# Patient Record
Sex: Male | Born: 1970 | Race: White | Hispanic: No
Health system: Southern US, Community
[De-identification: ages and names within clinical notes are randomized; demographics above are authoritative.]

---

## 2010-02-05 ENCOUNTER — Emergency Department (HOSPITAL_COMMUNITY): Admission: EM | Admit: 2010-02-05 | Discharge: 2010-02-05 | Payer: Self-pay | Admitting: Emergency Medicine

## 2010-07-15 LAB — CBC
HCT: 43 % (ref 39.0–52.0)
Hemoglobin: 15.6 g/dL (ref 13.0–17.0)
MCH: 33.1 pg (ref 26.0–34.0)
MCHC: 36.3 g/dL — ABNORMAL HIGH (ref 30.0–36.0)
MCV: 91.3 fL (ref 78.0–100.0)
RDW: 12.4 % (ref 11.5–15.5)

## 2010-07-15 LAB — POCT I-STAT, CHEM 8
Calcium, Ion: 1.1 mmol/L — ABNORMAL LOW (ref 1.12–1.32)
Chloride: 108 mEq/L (ref 96–112)
Glucose, Bld: 89 mg/dL (ref 70–99)
HCT: 46 % (ref 39.0–52.0)
Hemoglobin: 15.6 g/dL (ref 13.0–17.0)
TCO2: 22 mmol/L (ref 0–100)

## 2010-07-15 LAB — URINALYSIS, ROUTINE W REFLEX MICROSCOPIC
Bilirubin Urine: NEGATIVE
Ketones, ur: NEGATIVE mg/dL
Nitrite: NEGATIVE
Specific Gravity, Urine: 1.015 (ref 1.005–1.030)
Urobilinogen, UA: 0.2 mg/dL (ref 0.0–1.0)
pH: 6 (ref 5.0–8.0)

## 2010-07-15 LAB — DIFFERENTIAL
Basophils Absolute: 0 10*3/uL (ref 0.0–0.1)
Basophils Relative: 0 % (ref 0–1)
Eosinophils Absolute: 0 10*3/uL (ref 0.0–0.7)
Eosinophils Relative: 0 % (ref 0–5)
Monocytes Absolute: 0.5 10*3/uL (ref 0.1–1.0)
Monocytes Relative: 7 % (ref 3–12)

## 2010-07-15 LAB — URINE CULTURE: Culture  Setup Time: 201110080035

## 2010-07-15 LAB — URINE MICROSCOPIC-ADD ON

## 2011-03-07 IMAGING — CT CT ABD-PELV W/O CM
2 of 4 series · 14 of 32 positions shown, 19 images · non-contrast
Comparison: None.

CLINICAL DATA: Right lower abdominal pain and hematuria.

CT ABDOMEN AND PELVIS WITHOUT CONTRAST
TECHNIQUE: Multidetector CT imaging of the abdomen and pelvis was
performed following the standard protocol without intravenous
contrast.

[Series 2: renal stone · axial · 0.87mm/px · z∈[-532,-158]mm · 7 of 101 slices shown, 12 images]
[im 13/101  soft-tissue]
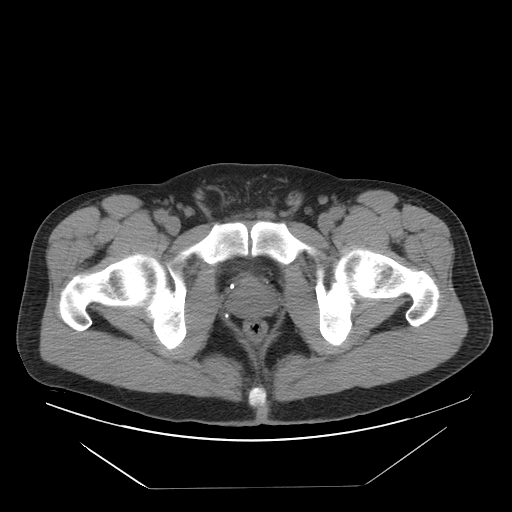
[im 13/101  bone]
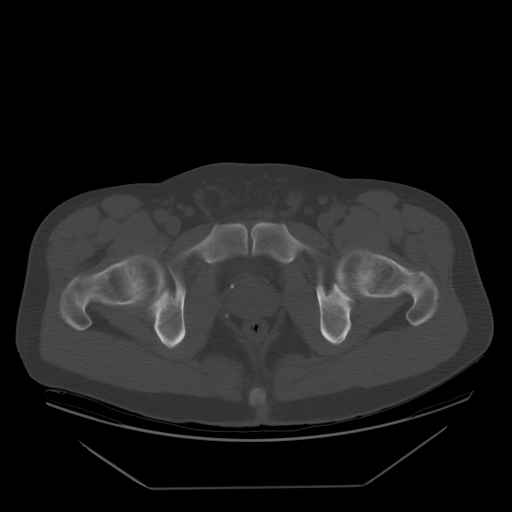
[im 26/101  soft-tissue]
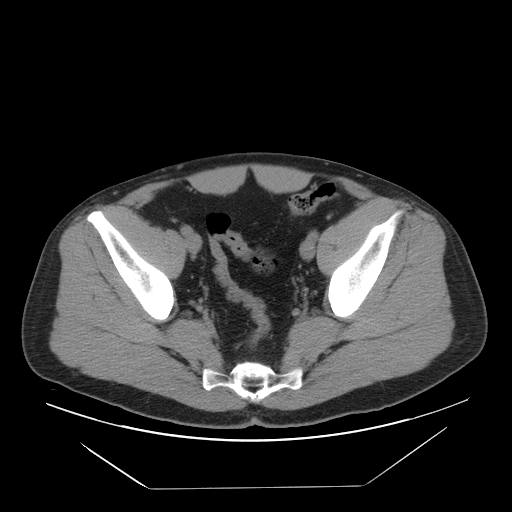
[im 38/101  soft-tissue]
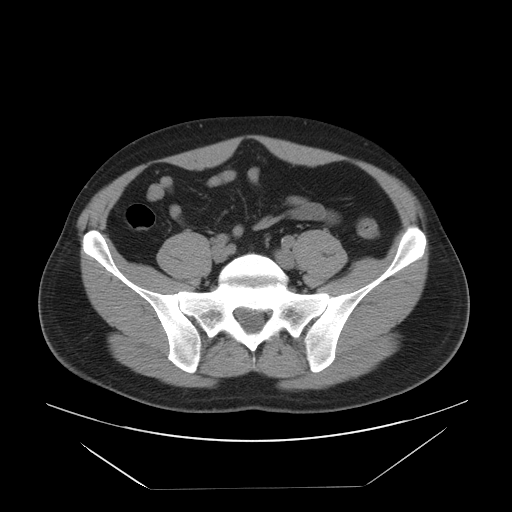
[im 51/101  soft-tissue]
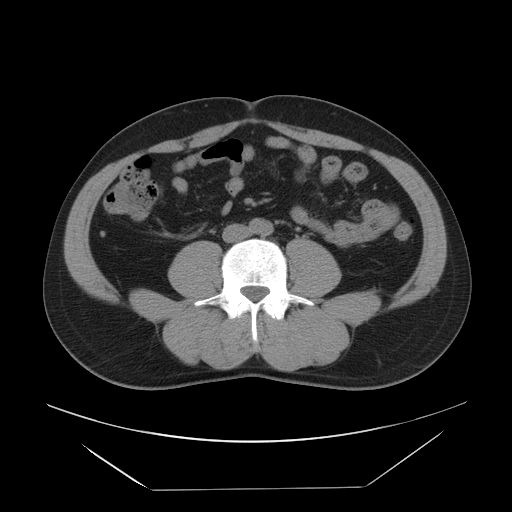
[im 51/101  lung]
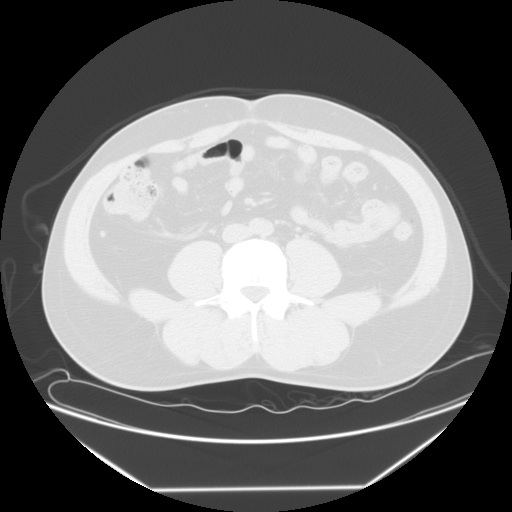
[im 63/101  soft-tissue]
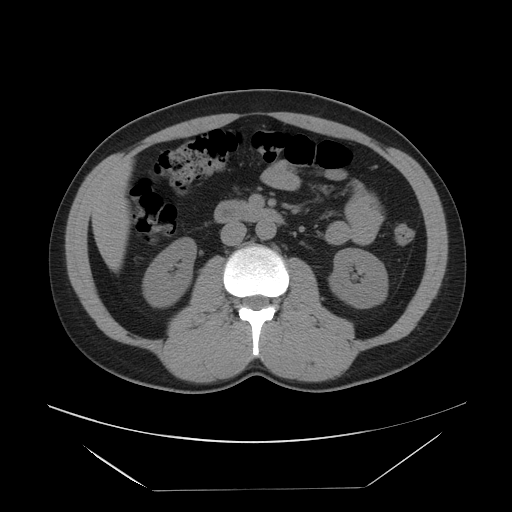
[im 63/101  lung]
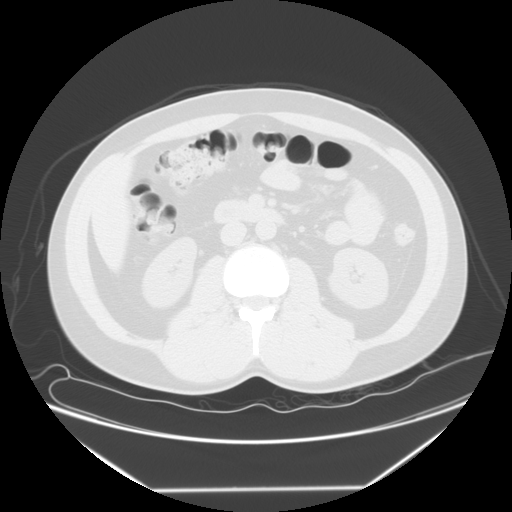
[im 76/101  soft-tissue]
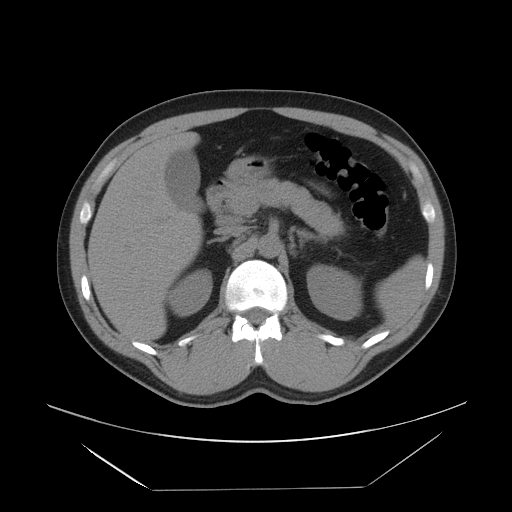
[im 76/101  lung]
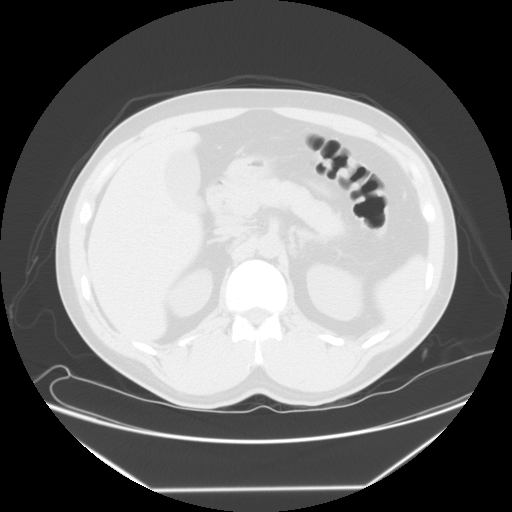
[im 88/101  soft-tissue]
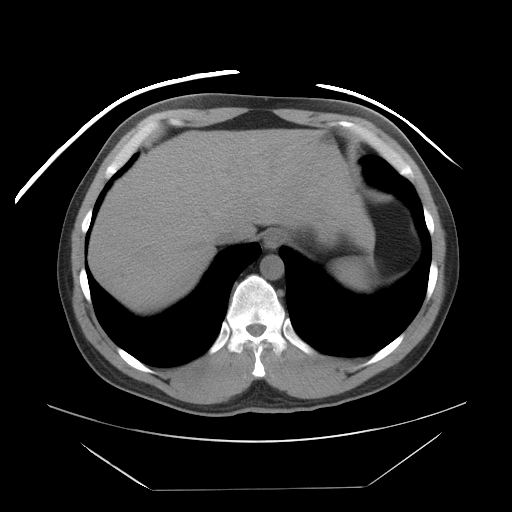
[im 88/101  lung]
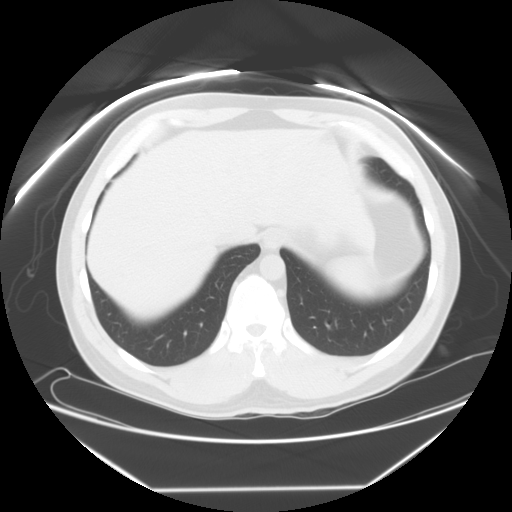

[Series 400: sag · sagittal · 1.00mm/px · 7 of 115 slices shown]
[im 12/115  soft-tissue]
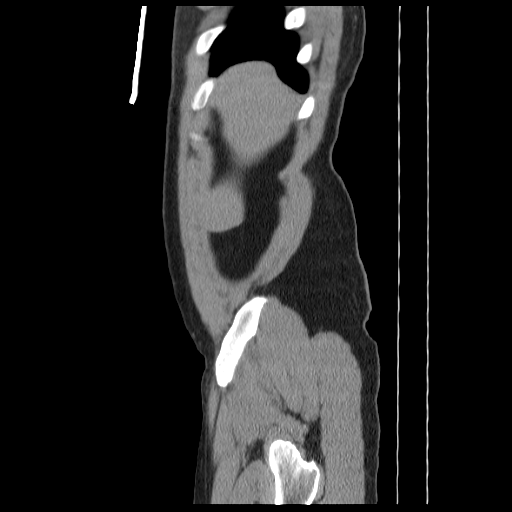
[im 23/115  soft-tissue]
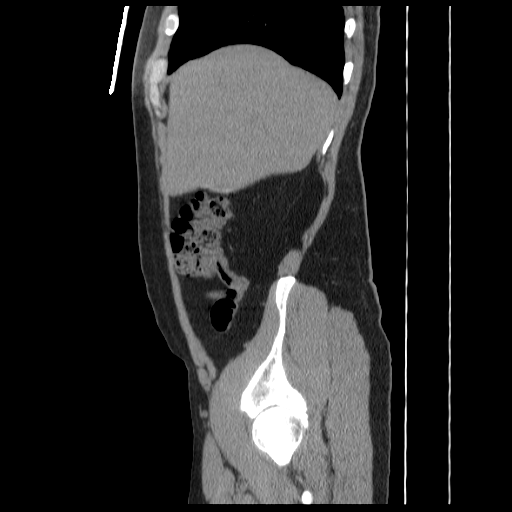
[im 35/115  soft-tissue]
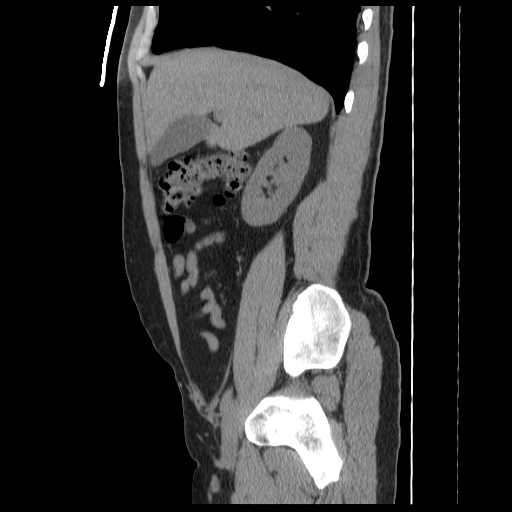
[im 46/115  soft-tissue]
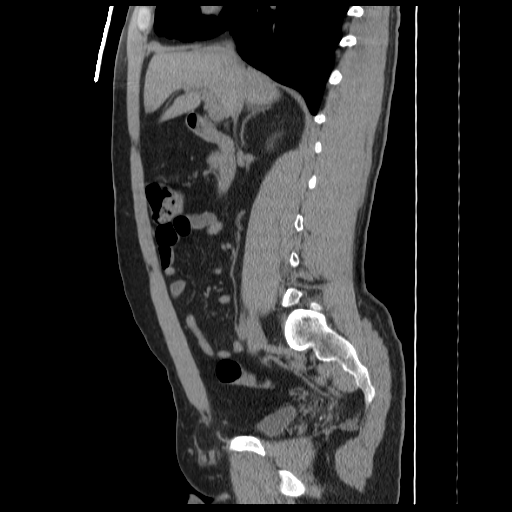
[im 69/115  soft-tissue]
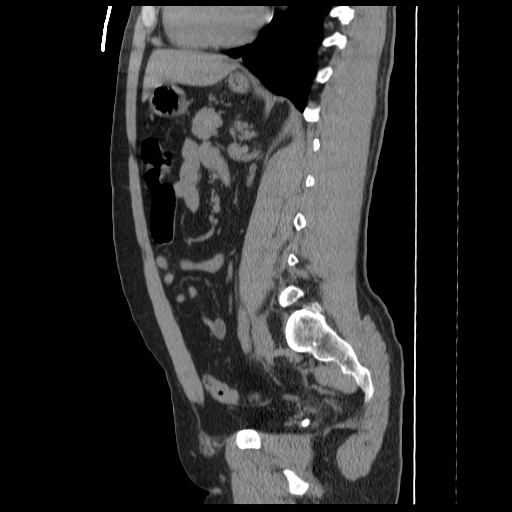
[im 80/115  soft-tissue]
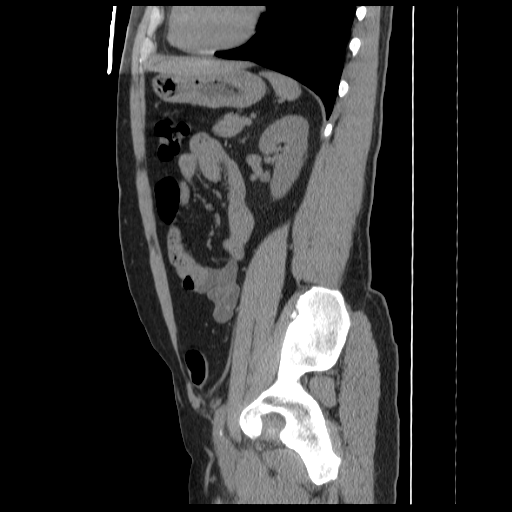
[im 92/115  soft-tissue]
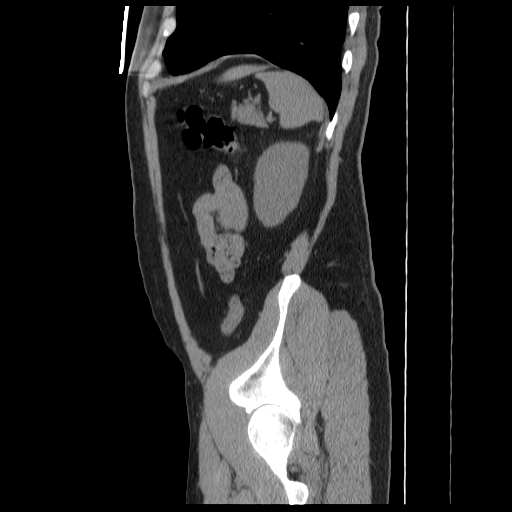

[14 of 32 positions shown; findings below may reference images not displayed]

FINDINGS: The liver and spleen are unremarkable on the CT scan
without IV contrast.  The stomach, duodenum, pancreas, gallbladder,
and adrenal glands are normal.

No evidence for renal calculi.  No secondary changes in either
kidney.  There is no evidence for hydroureter.  No ureteral
calculi.  No bladder calculi.

No abdominal aortic aneurysm.  There is no free fluid in the
abdomen.  There is no abdominal lymphadenopathy.

Imaging through the pelvis shows no free fluid.  No pelvic sidewall
lymphadenopathy.  Prostate gland is unremarkable.  No substantial
diverticular change in the colon.  No evidence for diverticulitis.
The terminal ileum and the appendix are normal.

A right inguinal hernia contains only fat.

Bone windows reveal no worrisome lytic or sclerotic osseous
lesions.
IMPRESSION: No acute findings.  Specifically, no evidence for urinary calculi.
No evidence to explain the patient's history of abdominal pain and
hematuria.

## 2016-11-16 NOTE — Progress Notes (Signed)
CH made a short visit
